# Patient Record
Sex: Female | Born: 1975 | Race: White | Hispanic: No | Marital: Married | Smoking: Former smoker
Health system: Southern US, Community
[De-identification: ages and names within clinical notes are randomized; demographics above are authoritative.]

---

## 2006-01-20 ENCOUNTER — Emergency Department: Payer: Self-pay | Admitting: Emergency Medicine

## 2010-01-15 ENCOUNTER — Emergency Department: Payer: Self-pay | Admitting: Emergency Medicine

## 2013-05-26 ENCOUNTER — Emergency Department: Payer: Self-pay | Admitting: Emergency Medicine

## 2014-09-24 ENCOUNTER — Observation Stay: Payer: Self-pay

## 2014-09-24 LAB — PIH PROFILE
Anion Gap: 9 (ref 7–16)
BUN: 9 mg/dL (ref 7–18)
CALCIUM: 9.5 mg/dL (ref 8.5–10.1)
CREATININE: 0.68 mg/dL (ref 0.60–1.30)
Chloride: 105 mmol/L (ref 98–107)
Co2: 22 mmol/L (ref 21–32)
GLUCOSE: 87 mg/dL (ref 65–99)
HCT: 35.2 % (ref 35.0–47.0)
HGB: 11.7 g/dL — ABNORMAL LOW (ref 12.0–16.0)
MCH: 30.8 pg (ref 26.0–34.0)
MCHC: 33.1 g/dL (ref 32.0–36.0)
MCV: 93 fL (ref 80–100)
Osmolality: 270 (ref 275–301)
PLATELETS: 227 10*3/uL (ref 150–440)
Potassium: 4 mmol/L (ref 3.5–5.1)
RBC: 3.79 10*6/uL — ABNORMAL LOW (ref 3.80–5.20)
RDW: 13.5 % (ref 11.5–14.5)
SGOT(AST): 20 U/L (ref 15–37)
Sodium: 136 mmol/L (ref 136–145)
URIC ACID: 4.7 mg/dL (ref 2.6–6.0)
WBC: 10.1 10*3/uL (ref 3.6–11.0)

## 2014-09-24 LAB — PROTEIN / CREATININE RATIO, URINE: CREATININE, URINE: 12.4 mg/dL — AB (ref 30.0–125.0)

## 2014-10-06 ENCOUNTER — Inpatient Hospital Stay: Payer: Self-pay | Admitting: Obstetrics and Gynecology

## 2014-12-21 LAB — SURGICAL PATHOLOGY

## 2014-12-27 NOTE — Op Note (Signed)
PATIENT NAME:  Tracey Cochran, Tracey Cochran MR#:  [id number] DATE OF BIRTH:  [birth year]  DATE OF PROCEDURE:  10/06/2014  PREOPERATIVE DIAGNOSES:  1. Severe preeclampsia at 35 and 5/7 weeks.  2. Suspected intrauterine growth restriction.  3. Category 2 fetal heart rate tracing remote from delivery.  4. Right positive group B Streptococcus unknown  5. Desires sterilization.   POSTOPERATIVE DIAGNOSES: 1. Severe preeclampsia at 35 and 5/7 weeks.  2. Suspected intrauterine growth restriction.  3. Category 2 fetal heart rate tracing remote from delivery.  4. Right positive group B Streptococcus unknown  5. Desires sterilization.  6. Delivery of a viable female infant   SURGEON: Angelina Pih, MD.  PROCEDURES: Primary low transverse cesarean section and bilateral tubal ligation by the [place] method.   ESTIMATED BLOOD LOSS: 700 mL.   INTRAVENOUS FLUIDS: 1400 mL.   URINE OUTPUT: 400 mL.   SPECIMENS:  1. Placenta.  2. Portion of right and portion of left fallopian tubes   FINDINGS: A viable female infant weighing 1760 grams or 3 pounds 14 ounces with Apgars of 9 and 9. Normal uterus, ovaries, and tubes.   COMPLICATIONS: None.   INDICATION FOR PROCEDURE: Tracey Cochran presented at 41 and 5/7 weeks with severe preeclampsia by blood pressures and suspected IUGR. She was placed on continuous fetal monitoring, where late decelerations were noted on a contraction stress test. For this reason, the decision was made to proceed with cesarean delivery. The patient and her husband were consented, with full risks and benefits disclosed. NICU was alerted and present at the delivery.   PROCEDURE: The patient was taken to the operating room, where she was identified by name and birthdate. She was placed on the operating table in the upright position and spinal anesthesia was induced without difficulty. She was then positioned in a supine position and prepped and draped in the usual sterile fashion. Ancef 3 grams  were given prior to the start of the procedure for antibiotic prophylaxis. A formal timeout procedure was performed, with all team members present and in agreement.   A Pfannenstiel skin incision was made and taken down to the underlying fascia sharply. The fascia was incised in the midline, and the incision was carried laterally using Mayo scissors. The superior portion of the rectus fascia was tented up and dissected off the underlying rectus muscles, sharply. In a similar fashion, the inferior rectus fascia was tented up and dissected off the underlying rectus and pyramidalis muscle. The rectus muscles were then separated bluntly in the midline, and the peritoneal cavity was entered bluntly. The rectus muscles and peritoneal cavity entry point were stretched laterally, and a bladder blade was placed. A bladder flap was created and an Alexis retractor was placed into the peritoneal cavity. Examination of the uterus revealed no peritoneal adhesions.   A low transverse hysterotomy was carefully created, and taken down to the underlying amniotic sac. The sac was ruptured for clear fluid. The hysterotomy was extended laterally and cephalad using blunt traction. The fetal vertex was maneuvered gently to the hysterotomy and using fundal pressure, the baby was delivered atraumatically. There was no nuchal cord. The cord was doubly clamped and cut and the baby was immediately vigorous and active. The baby was passed off to awaiting NICU staff.   Cord blood was collected, and the placenta was expressed. The uterus was cleared of all clots and debris, and was exteriorized. The hysterotomy was closed in a running locked fashion using 0 Vicryl suture and was  imbricated using 0 Vicryl suture for excellent hemostasis. Attention was then turned to the left fallopian tube.   The left fallopian tube was grasped with a Babcock clamp and a small window was made in the mesosalpinx, being careful to avoid surrounding vessels.  Approximately 3 cm of the tube was tied off with double suture ligation using plain gut suture. The tube was removed using Metzenbaum scissors and hemostasis was noted. Attention then was turned to the right fallopian tube in a similar fashion. The right fallopian tube was doubly tied, both distal and proximally, and excised using Metzenbaum scissors. Excellent hemostasis was noted at the right adnexa, as well. The uterus was then carefully placed back into the peritoneal cavity, which was cleared of clots and debris. Examination of both adnexa revealed no bleeding at the tubal excision sites. Attention returned back to the hysterotomy, which was noted to be hemostatic. The Alexis retractor was then removed. A bladder blade was placed, and examination of the peritoneum revealed no new bleeding. In a similar fashion, the rectus muscles and the rectus fascia were examined and no bleeding was found. The rectus fascia was then closed using running Vicryl suture for excellent reapproximation. The subcutaneous tissue was irrigated and, as it was greater than 2 cm thick, it was closed with a running suture, loosely. The skin incision was closed using staples, withexcellent reapproximation. A pressure dressing was placed.   The patient tolerated these procedures well and is now stable in recovery. She will receive magnesium postpartum for 24 hours with careful monitoring of her pulmonary and renal status.    ____________________________ Angelina Pih, MD beb:mw D: 10/07/2014 07:36:34 ET T: 10/07/2014 11:18:57 ET JOB#: [id number]  cc: Angelina Pih, MD, <Dictator> Angelina Pih MD ELECTRONICALLY SIGNED 11/06/2014 7:28

## 2015-01-05 NOTE — H&P (Signed)
L&D Evaluation:  History:  HPI 39yo G1P0 at 35+5wks today presenting from the office for iol/severe preeclampsia and non-reassuring fetal heart tracing. She was dx with gHTN several weeks ago and started on labetolol. She is without protein in urine and does have normal PreE labs; however, pt developed severe range pressures into the 160s/100s. Growth u/s today found EFW 16% with HC <1%, normal fluid. Uterine artery Dopplers are not available at this time. NST in office was non-reactive.  Pt admitted for iol with cervical ripening: Bishop score 0. On admission, pt did have several late decels without reactivity when started on CST. Primary cesarean section called urgently for Cat II strip remote from delivery with placental insufficiency in a severe PreEclamptic.  Pt received 1 dose steriods for fetal lung maturity based on ALPS study. She will receive Mag for 24hrs postpartum and iv antihypertensives PRN severe pressures. GBS unknown, ampicillin ordered.   NICU at delivery.  She has good fetal movement, no LOF. Denies sx of PreE.  She does desire sterilization and will have BTL following delivery.   Patient's Medical History No Chronic Illness   Patient's Surgical History none   Medications Pre Natal Vitamins  labetolol 200 BID   Allergies latex   Social History none   ROS:  ROS All systems were reviewed.  HEENT, CNS, GI, GU, Respiratory, CV, Renal and Musculoskeletal systems were found to be normal.   Exam:  Vital Signs BP >140/90   Urine Protein not completed   General no apparent distress   Mental Status clear   Chest clear   Heart normal sinus rhythm   Abdomen gravid, non-tender   Estimated Fetal Weight Small for gestational age   Fetal Position vertex   Back no CVAT   Edema 1+   Reflexes 2+   Clonus negative   Pelvic no external lesions, cervix closed and thick   Mebranes Intact   FHT as above   FHT Description Late decelerations   Ucx irregular    Skin dry   Lymph no lymphadenopathy   Impression:  Impression severe preeclampsia   Plan:  Plan PIH panel, antibiotics for GBBS prophylaxis, fluids, C/S   Comments 39yo G1P0 at 35+5wks, iol/severe PreE with placental insufficiency - pLTCS planned for Cat II strip  - GBS ppx for GBS unknown in Preterm delivery - NICU at delivery - s/p 1 dose BMZ for lung maturity - BTL - Mag for 24hrs postpartum - iv antihypertensives for severe range pressures.   Electronic Signatures: Angelina Pih (MD)  (Signed 09-Feb-16 17:45)  Authored: L&D Evaluation   Last Updated: 09-Feb-16 17:45 by Angelina Pih (MD)

## 2015-05-24 ENCOUNTER — Other Ambulatory Visit: Payer: Self-pay | Admitting: Family Medicine

## 2015-05-24 DIAGNOSIS — R1012 Left upper quadrant pain: Secondary | ICD-10-CM

## 2015-05-24 DIAGNOSIS — R109 Unspecified abdominal pain: Secondary | ICD-10-CM

## 2015-05-27 ENCOUNTER — Ambulatory Visit: Payer: Self-pay

## 2015-05-27 ENCOUNTER — Ambulatory Visit
Admission: RE | Admit: 2015-05-27 | Discharge: 2015-05-27 | Disposition: A | Discharge status: Home / Self Care | Payer: 59 | Source: Ambulatory Visit | Attending: Family Medicine | Admitting: Family Medicine

## 2015-05-27 DIAGNOSIS — R1012 Left upper quadrant pain: Secondary | ICD-10-CM | POA: Insufficient documentation

## 2015-11-19 ENCOUNTER — Other Ambulatory Visit: Payer: Self-pay

## 2015-11-19 ENCOUNTER — Emergency Department
Admission: EM | Admit: 2015-11-19 | Discharge: 2015-11-19 | Disposition: A | Discharge status: Home / Self Care | Payer: 59 | Attending: Emergency Medicine | Admitting: Emergency Medicine

## 2015-11-19 ENCOUNTER — Emergency Department: Payer: 59

## 2015-11-19 ENCOUNTER — Encounter: Payer: Self-pay | Admitting: Emergency Medicine

## 2015-11-19 DIAGNOSIS — R197 Diarrhea, unspecified: Secondary | ICD-10-CM | POA: Insufficient documentation

## 2015-11-19 DIAGNOSIS — R011 Cardiac murmur, unspecified: Secondary | ICD-10-CM | POA: Diagnosis not present

## 2015-11-19 DIAGNOSIS — R110 Nausea: Secondary | ICD-10-CM | POA: Insufficient documentation

## 2015-11-19 DIAGNOSIS — Z87891 Personal history of nicotine dependence: Secondary | ICD-10-CM | POA: Insufficient documentation

## 2015-11-19 DIAGNOSIS — R079 Chest pain, unspecified: Secondary | ICD-10-CM

## 2015-11-19 LAB — COMPREHENSIVE METABOLIC PANEL
ALT: 15 U/L (ref 14–54)
ANION GAP: 3 — AB (ref 5–15)
AST: 15 U/L (ref 15–41)
Albumin: 4.3 g/dL (ref 3.5–5.0)
Alkaline Phosphatase: 68 U/L (ref 38–126)
BUN: 10 mg/dL (ref 6–20)
CHLORIDE: 107 mmol/L (ref 101–111)
CO2: 26 mmol/L (ref 22–32)
CREATININE: 0.75 mg/dL (ref 0.44–1.00)
Calcium: 9 mg/dL (ref 8.9–10.3)
GFR calc non Af Amer: >60 mL/min (ref >60)
Glucose, Bld: 103 mg/dL — ABNORMAL HIGH (ref 65–99)
POTASSIUM: 3.9 mmol/L (ref 3.5–5.1)
SODIUM: 136 mmol/L (ref 135–145)
Total Bilirubin: 0.6 mg/dL (ref 0.3–1.2)
Total Protein: 7.5 g/dL (ref 6.5–8.1)

## 2015-11-19 LAB — CBC
HCT: 37.8 % (ref 35.0–47.0)
Hemoglobin: 12.5 g/dL (ref 12.0–16.0)
MCH: 28 pg (ref 26.0–34.0)
MCHC: 33 g/dL (ref 32.0–36.0)
MCV: 84.7 fL (ref 80.0–100.0)
PLATELETS: 284 10*3/uL (ref 150–440)
RBC: 4.46 MIL/uL (ref 3.80–5.20)
RDW: 14 % (ref 11.5–14.5)
WBC: 6.3 10*3/uL (ref 3.6–11.0)

## 2015-11-19 LAB — TROPONIN I: Troponin I: <0.03 ng/mL (ref <0.031)

## 2015-11-19 MED ORDER — ONDANSETRON 4 MG PO TBDP: 4.0000 mg | ORAL_TABLET | Freq: Three times a day (TID) | ORAL | Status: AC | PRN

## 2015-11-19 NOTE — ED Provider Notes (Signed)
[facility] Emergency Department Provider Note  Time seen: 8:46 AM  I have reviewed the triage vital signs and the nursing notes.   HISTORY  Chief Complaint Chest Pain and Diarrhea    HPI Tracey Cochran is a 40 y.o. female with no past medical history who presents the emergency department left chest discomfort. According to the patient beginning last night she began experiencing discomfort and tightness in her left chest. She thought it would go away overnight however when she woke this morning she continued to have mild left chest discomfort/tightness. She also noted that she felt nauseated this morning and had 3 episodes of loose stool. Denies any abdominal pain, dysuria, black or bloody stool, fever. Describes the chest discomfort as a tightness sensation located mostly in the central to left chest, mild currently. Denies any nausea currently. Denies any shortness of breath or diaphoresis.     History reviewed. No pertinent past medical history.  There are no active problems to display for this patient.   Past Surgical History  Procedure Laterality Date  . Cesarean section      No current outpatient prescriptions on file.  Allergies Review of patient's allergies indicates no known allergies.  No family history on file.  Social History Social History  Substance Use Topics  . Smoking status: Former Research scientist (life sciences)  . Smokeless tobacco: None  . Alcohol Use: None    Review of Systems Constitutional: Negative for fever. Cardiovascular: Mild left chest tightness/discomfort Respiratory: Negative for shortness of breath. Gastrointestinal: Negative for abdominal pain. Positive for nausea, positive for diarrhea. Genitourinary: Negative for dysuria. Neurological: Negative for headache 10-point ROS otherwise negative.  ____________________________________________   PHYSICAL EXAM:  VITAL SIGNS: ED Triage Vitals  Enc Vitals Group     BP 11/19/15 0813  136/90 mmHg     Pulse Rate 11/19/15 0813 94     Resp 11/19/15 0813 18     Temp 11/19/15 0813 98.6 F (37 C)     Temp Source 11/19/15 0813 Oral     SpO2 11/19/15 0813 98 %     Weight 11/19/15 0813 210 lb (95.255 kg)     Height 11/19/15 0813 5\' 3"  (1.6 m)     Head Cir --      Peak Flow --      Pain Score --      Pain Loc --      Pain Edu? --      Excl. in [place]? --     Constitutional: Alert and oriented. Well appearing and in no distress. Eyes: Normal exam ENT   Head: Normocephalic and atraumatic.   Mouth/Throat: Mucous membranes are moist. Cardiovascular: Normal rate, regular rhythm. Mild systolic murmur. Respiratory: Normal respiratory effort without tachypnea nor retractions. Breath sounds are clear Gastrointestinal: Soft and nontender. No distention.  Musculoskeletal: Nontender with normal range of motion in all extremities. No lower extremity tenderness or edema. Neurologic:  Normal speech and language. No gross focal neurologic deficits Skin:  Skin is warm, dry and intact.  Psychiatric: Mood and affect are normal. Speech and behavior are normal.  ____________________________________________    EKG  EKG reviewed and interpreted, so shows normal sinus rhythm at 89 bpm, narrow QRS, normal axis, normal intervals, no ST changes. Normal EKG.  ____________________________________________    RADIOLOGY  Chest x-ray shows no acute abnormality  ____________________________________________    INITIAL IMPRESSION / ASSESSMENT AND PLAN / ED COURSE  Pertinent labs & imaging results that were available during my care of  the patient were reviewed by me and considered in my medical decision making (see chart for details).  Patient presents with mild left-sided chest discomfort/tightness. Overall normal exam. Well-appearing patient. EKG is normal. We will obtain a chest x-ray as well as labs, and closely monitor in the emergency department.  Labs within normal limits.  Troponin negative. EKG is normal. Chest x-ray is normal. Patient feels much better at this time. We'll discharge patient home. I discussed my normal return precautions, she is agreeable.  ____________________________________________   FINAL CLINICAL IMPRESSION(S) / ED DIAGNOSES  Chest pain   Harvest Dark, MD 11/19/15 1018

## 2015-11-19 NOTE — Discharge Instructions (Signed)
You have been seen in the emergency department today for chest pain. Your workup has shown normal results. As we discussed please follow-up with your primary care physician in the next 1-2 days for recheck. Return to the emergency department for any further chest pain, trouble breathing, or any other symptom personally concerning to yourself. ° °Nonspecific Chest Pain °It is often hard to find the cause of chest pain. There is always a chance that your pain could be related to something serious, such as a heart attack or a blood clot in your lungs. Chest pain can also be caused by conditions that are not life-threatening. If you have chest pain, it is very important to follow up with your doctor. ° °HOME CARE °· If you were prescribed an antibiotic medicine, finish it all even if you start to feel better. °· Avoid any activities that cause chest pain. °· Do not use any tobacco products, including cigarettes, chewing tobacco, or electronic cigarettes. If you need help quitting, ask your doctor. °· Do not drink alcohol. °· Take medicines only as told by your doctor. °· Keep all follow-up visits as told by your doctor. This is important. This includes any further testing if your chest pain does not go away. °· Your doctor may tell you to keep your head raised (elevated) while you sleep. °· Make lifestyle changes as told by your doctor. These may include: °¨ Getting regular exercise. Ask your doctor to suggest some activities that are safe for you. °¨ Eating a heart-healthy diet. Your doctor or a diet specialist (dietitian) can help you to learn healthy eating options. °¨ Maintaining a healthy weight. °¨ Managing diabetes, if necessary. °¨ Reducing stress. °GET HELP IF: °· Your chest pain does not go away, even after treatment. °· You have a rash with blisters on your chest. °· You have a fever. °GET HELP RIGHT AWAY IF: °· Your chest pain is worse. °· You have an increasing cough, or you cough up blood. °· You have  severe belly (abdominal) pain. °· You feel extremely weak. °· You pass out (faint). °· You have chills. °· You have sudden, unexplained chest discomfort. °· You have sudden, unexplained discomfort in your arms, back, neck, or jaw. °· You have shortness of breath at any time. °· You suddenly start to sweat, or your skin gets clammy. °· You feel nauseous. °· You vomit. °· You suddenly feel light-headed or dizzy. °· Your heart begins to beat quickly, or it feels like it is skipping beats. °These symptoms may be an emergency. Do not wait to see if the symptoms will go away. Get medical help right away. Call your local emergency services (911 in the [place] Do not drive yourself to the hospital. °  °This information is not intended to replace advice given to you by your health care provider. Make sure you discuss any questions you have with your health care provider. °  °Document Released: 01/31/2008 Document Revised: 09/04/2014 Document Reviewed: 03/20/2014 °Elsevier Interactive Patient Education ©2016 [employer] ° °

## 2015-11-19 NOTE — ED Notes (Signed)
Patient transported to X-ray 

## 2015-11-19 NOTE — ED Notes (Signed)
Chest heaviness since last pm, dizziness, nausea and diarrhea.  NAD at this time.

## 2016-11-17 ENCOUNTER — Encounter: Payer: Self-pay | Admitting: Emergency Medicine

## 2016-11-17 ENCOUNTER — Emergency Department
Admission: EM | Admit: 2016-11-17 | Discharge: 2016-11-17 | Disposition: A | Discharge status: Home / Self Care | Payer: 59 | Attending: Emergency Medicine | Admitting: Emergency Medicine

## 2016-11-17 DIAGNOSIS — K219 Gastro-esophageal reflux disease without esophagitis: Secondary | ICD-10-CM | POA: Diagnosis not present

## 2016-11-17 DIAGNOSIS — Z87891 Personal history of nicotine dependence: Secondary | ICD-10-CM | POA: Insufficient documentation

## 2016-11-17 DIAGNOSIS — R1013 Epigastric pain: Secondary | ICD-10-CM | POA: Diagnosis present

## 2016-11-17 LAB — COMPREHENSIVE METABOLIC PANEL
ALT: 16 U/L (ref 14–54)
ANION GAP: 7 (ref 5–15)
AST: 15 U/L (ref 15–41)
Albumin: 4.5 g/dL (ref 3.5–5.0)
Alkaline Phosphatase: 58 U/L (ref 38–126)
BUN: 13 mg/dL (ref 6–20)
CALCIUM: 10.6 mg/dL — AB (ref 8.9–10.3)
CHLORIDE: 105 mmol/L (ref 101–111)
CO2: 26 mmol/L (ref 22–32)
CREATININE: 0.65 mg/dL (ref 0.44–1.00)
GFR calc non Af Amer: >60 mL/min (ref >60)
Glucose, Bld: 104 mg/dL — ABNORMAL HIGH (ref 65–99)
Potassium: 4 mmol/L (ref 3.5–5.1)
SODIUM: 138 mmol/L (ref 135–145)
TOTAL PROTEIN: 8 g/dL (ref 6.5–8.1)
Total Bilirubin: 0.8 mg/dL (ref 0.3–1.2)

## 2016-11-17 LAB — URINALYSIS, COMPLETE (UACMP) WITH MICROSCOPIC
Bacteria, UA: NONE SEEN
Bilirubin Urine: NEGATIVE
GLUCOSE, UA: NEGATIVE mg/dL
KETONES UR: NEGATIVE mg/dL
LEUKOCYTES UA: NEGATIVE
NITRITE: NEGATIVE
PH: 8 (ref 5.0–8.0)
PROTEIN: NEGATIVE mg/dL
Specific Gravity, Urine: 1.002 — ABNORMAL LOW (ref 1.005–1.030)

## 2016-11-17 LAB — CBC
HCT: 38.2 % (ref 35.0–47.0)
Hemoglobin: 13 g/dL (ref 12.0–16.0)
MCH: 29.7 pg (ref 26.0–34.0)
MCHC: 33.9 g/dL (ref 32.0–36.0)
MCV: 87.7 fL (ref 80.0–100.0)
PLATELETS: 308 10*3/uL (ref 150–440)
RBC: 4.36 MIL/uL (ref 3.80–5.20)
RDW: 13.9 % (ref 11.5–14.5)
WBC: 9 10*3/uL (ref 3.6–11.0)

## 2016-11-17 LAB — LIPASE, BLOOD: LIPASE: 40 U/L (ref 11–51)

## 2016-11-17 LAB — POCT PREGNANCY, URINE: Preg Test, Ur: NEGATIVE

## 2016-11-17 MED ORDER — GI COCKTAIL ~~LOC~~
30.0000 mL | Freq: Once | ORAL | Status: AC
  Administered 2016-11-17: 30 mL via ORAL
  Filled 2016-11-17: qty 30

## 2016-11-17 MED ORDER — FAMOTIDINE IN NACL 20-0.9 MG/50ML-% IV SOLN
20.0000 mg | Freq: Once | INTRAVENOUS | Status: AC
  Administered 2016-11-17: 20 mg via INTRAVENOUS
  Filled 2016-11-17: qty 50

## 2016-11-17 MED ORDER — FAMOTIDINE 20 MG PO TABS: 20.0000 mg | ORAL_TABLET | Freq: Two times a day (BID) | ORAL | 1 refills | Status: AC

## 2016-11-17 NOTE — ED Provider Notes (Signed)
[facility] Emergency Department Provider Note  ____________________________________________  Time seen: Approximately 7:27 AM  I have reviewed the triage vital signs and the nursing notes.   HISTORY  Chief Complaint Abdominal Pain   HPI Tracey Cochran is a 41 y.o. female no significant past medical history who presents for evaluation of epigastric abdominal pain. Patient reports that her pain started yesterday evening at 8 PM she describes as a burning sensation located in her epigastrium, constant throughout the night, initially radiating to her back. She reports that the pain is getting better and is now 5 out of 10. She took Tums last night with no improvement. She denies history of reflux and denies ever having similar pain. She denies chest pain, shortness of breath, nausea, vomiting, diarrhea, constipation, fever, chills, dysuria or hematuria. Patient reports that she has been recently started on Augmentin for sinus infection. She doesn't take any other medications at home.  History reviewed. No pertinent past medical history.  There are no active problems to display for this patient.   Past Surgical History:  Procedure Laterality Date  . CESAREAN SECTION      Prior to Admission medications   Medication Sig Start Date End Date Taking? Authorizing Provider  famotidine (PEPCID) 20 MG tablet Take 1 tablet (20 mg total) by mouth 2 (two) times daily. 11/17/16 11/17/17  Rudene Re, MD  ondansetron (ZOFRAN ODT) 4 MG disintegrating tablet Take 1 tablet (4 mg total) by mouth every 8 (eight) hours as needed for nausea or vomiting. 11/19/15   Harvest Dark, MD    Allergies Patient has no known allergies.  History reviewed. No pertinent family history.  Social History Social History  Substance Use Topics  . Smoking status: Former Research scientist (life sciences)  . Smokeless tobacco: Never Used  . Alcohol use No    Review of Systems  Constitutional: Negative for  fever. Eyes: Negative for visual changes. ENT: Negative for sore throat. Neck: No neck pain  Cardiovascular: Negative for chest pain. Respiratory: Negative for shortness of breath. Gastrointestinal: + epigastric abdominal pain. No vomiting or diarrhea. Genitourinary: Negative for dysuria. Musculoskeletal: Negative for back pain. Skin: Negative for rash. Neurological: Negative for headaches, weakness or numbness. Psych: No SI or HI  ____________________________________________   PHYSICAL EXAM:  VITAL SIGNS: ED Triage Vitals [11/17/16 0607]  Enc Vitals Group     BP (!) 136/91     Pulse Rate 77     Resp 18     Temp 98.6 F (37 C)     Temp Source Oral     SpO2 99 %     Weight 219 lb (99.3 kg)     Height 5\' 3"  (1.6 m)     Head Circumference      Peak Flow      Pain Score 7     Pain Loc      Pain Edu?      Excl. in [place]?     Constitutional: Alert and oriented. Well appearing and in no apparent distress. HEENT:      Head: Normocephalic and atraumatic.         Eyes: Conjunctivae are normal. Sclera is non-icteric. EOMI. PERRL      Mouth/Throat: Mucous membranes are moist.       Neck: Supple with no signs of meningismus. Cardiovascular: Regular rate and rhythm. No murmurs, gallops, or rubs. 2+ symmetrical distal pulses are present in all extremities. No JVD. Respiratory: Normal respiratory effort. Lungs are clear to auscultation bilaterally.  No wheezes, crackles, or rhonchi.  Gastrointestinal: Soft, mild ttp over the epigastric region, and non distended with positive bowel sounds. No rebound or guarding. Genitourinary: No CVA tenderness. Musculoskeletal: Nontender with normal range of motion in all extremities. No edema, cyanosis, or erythema of extremities. Neurologic: Normal speech and language. Face is symmetric. Moving all extremities. No gross focal neurologic deficits are appreciated. Skin: Skin is warm, dry and intact. No rash noted. Psychiatric: Mood and affect are  normal. Speech and behavior are normal.  ____________________________________________   LABS (all labs ordered are listed, but only abnormal results are displayed)  Labs Reviewed  COMPREHENSIVE METABOLIC PANEL - Abnormal; Notable for the following:       Result Value   Glucose, Bld 104 (*)    Calcium 10.6 (*)    All other components within normal limits  URINALYSIS, COMPLETE (UACMP) WITH MICROSCOPIC - Abnormal; Notable for the following:    Color, Urine COLORLESS (*)    APPearance CLEAR (*)    Specific Gravity, Urine 1.002 (*)    Hgb urine dipstick MODERATE (*)    Squamous Epithelial / LPF 0-5 (*)    All other components within normal limits  LIPASE, BLOOD  CBC  POC URINE PREG, ED  POCT PREGNANCY, URINE   ____________________________________________  EKG  ED ECG REPORT I, Rudene Re, the attending physician, personally viewed and interpreted this ECG.  Normal sinus rhythm, rate of 79, normal intervals, normal axis, no ST elevations or depressions. Normal EKG. ____________________________________________  RADIOLOGY  none  ____________________________________________   PROCEDURES  Procedure(s) performed: None Procedures Critical Care performed:  None ____________________________________________   INITIAL IMPRESSION / ASSESSMENT AND PLAN / ED COURSE  41 y.o. female no significant past medical history who presents for evaluation of epigastric abdominal pain that has been present since last night. Patient is well-appearing, in no distress, has normal vital signs, she has mild tenderness to palpation in the epigastric region with no rebound or guarding. Blood work showing normal CBC, normal CMP and lipase. EKG with no evidence of ischemia. Presentation concerning for gastritis versus GERD. We'll give Pepcid and GI cocktail. Low suspicion for other acute intra-abdominal etiologies such as pancreatitis or gallbladder with normal labs and reassuring exam.  Clinical  Course as of Nov 17 908  Fri Nov 17, 2016  0851 Pain has resolved after IV Pepcid and a GI cocktail. We'll send patient home at this time with Pepcid.  [CV]    Clinical Course User Index [CV] Rudene Re, MD    Pertinent labs & imaging results that were available during my care of the patient were reviewed by me and considered in my medical decision making (see chart for details).    ____________________________________________   FINAL CLINICAL IMPRESSION(S) / ED DIAGNOSES  Final diagnoses:  Gastroesophageal reflux disease, esophagitis presence not specified      NEW MEDICATIONS STARTED DURING THIS VISIT:  New Prescriptions   FAMOTIDINE (PEPCID) 20 MG TABLET    Take 1 tablet (20 mg total) by mouth 2 (two) times daily.     Note:  This document was prepared using Dragon voice recognition software and may include unintentional dictation errors.    Rudene Re, MD 11/17/16 [phone]

## 2016-11-17 NOTE — ED Triage Notes (Signed)
Pt in with co epigastric pain since yest, no n.v.d or hx of the same. Pt is on augmentin for sinus infection.

## 2016-11-17 NOTE — ED Notes (Signed)
Pt reports some pain relief after receiving GI cocktail and IV Pepcid. Resting quietly in room with eyes closed. Respirations equal and unlabored.

## 2018-10-29 ENCOUNTER — Other Ambulatory Visit: Payer: Self-pay | Admitting: Obstetrics and Gynecology

## 2018-10-29 DIAGNOSIS — Z1231 Encounter for screening mammogram for malignant neoplasm of breast: Secondary | ICD-10-CM

## 2018-11-07 ENCOUNTER — Ambulatory Visit
Admission: RE | Admit: 2018-11-07 | Discharge: 2018-11-07 | Disposition: A | Discharge status: Home / Self Care | Payer: Managed Care, Other (non HMO) | Source: Ambulatory Visit | Attending: Obstetrics and Gynecology | Admitting: Obstetrics and Gynecology

## 2018-11-07 ENCOUNTER — Other Ambulatory Visit: Payer: Self-pay

## 2018-11-07 DIAGNOSIS — Z1231 Encounter for screening mammogram for malignant neoplasm of breast: Secondary | ICD-10-CM | POA: Diagnosis not present

## 2018-11-27 ENCOUNTER — Other Ambulatory Visit
Admission: RE | Admit: 2018-11-27 | Discharge: 2018-11-27 | Disposition: A | Discharge status: Home / Self Care | Payer: Managed Care, Other (non HMO) | Source: Ambulatory Visit | Attending: Physician Assistant | Admitting: Physician Assistant

## 2018-11-27 DIAGNOSIS — R0602 Shortness of breath: Secondary | ICD-10-CM | POA: Diagnosis not present

## 2018-11-27 DIAGNOSIS — R079 Chest pain, unspecified: Secondary | ICD-10-CM | POA: Insufficient documentation

## 2018-11-27 LAB — FIBRIN DERIVATIVES D-DIMER (ARMC ONLY): Fibrin derivatives D-dimer (ARMC): 151.74 ng/mL (FEU) (ref 0.00–499.00)

## 2019-03-18 ENCOUNTER — Other Ambulatory Visit: Payer: Self-pay | Admitting: Family Medicine

## 2019-03-18 DIAGNOSIS — R1902 Left upper quadrant abdominal swelling, mass and lump: Secondary | ICD-10-CM

## 2019-03-19 ENCOUNTER — Other Ambulatory Visit: Payer: Self-pay

## 2019-03-19 ENCOUNTER — Ambulatory Visit
Admission: RE | Admit: 2019-03-19 | Discharge: 2019-03-19 | Disposition: A | Discharge status: Home / Self Care | Payer: Managed Care, Other (non HMO) | Source: Ambulatory Visit | Attending: Family Medicine | Admitting: Family Medicine

## 2019-03-19 ENCOUNTER — Encounter (INDEPENDENT_AMBULATORY_CARE_PROVIDER_SITE_OTHER): Payer: Self-pay

## 2019-03-19 DIAGNOSIS — R1902 Left upper quadrant abdominal swelling, mass and lump: Secondary | ICD-10-CM | POA: Diagnosis not present

## 2019-03-24 ENCOUNTER — Other Ambulatory Visit: Payer: Self-pay | Admitting: Physician Assistant

## 2019-03-24 DIAGNOSIS — D171 Benign lipomatous neoplasm of skin and subcutaneous tissue of trunk: Secondary | ICD-10-CM

## 2019-03-24 DIAGNOSIS — R1012 Left upper quadrant pain: Secondary | ICD-10-CM

## 2019-04-07 ENCOUNTER — Ambulatory Visit: Payer: Managed Care, Other (non HMO)

## 2019-04-11 ENCOUNTER — Ambulatory Visit: Payer: Managed Care, Other (non HMO)

## 2019-07-16 ENCOUNTER — Other Ambulatory Visit: Payer: Self-pay

## 2019-07-16 DIAGNOSIS — Z20822 Contact with and (suspected) exposure to covid-19: Secondary | ICD-10-CM

## 2019-07-18 LAB — NOVEL CORONAVIRUS, NAA: SARS-CoV-2, NAA: NOT DETECTED

## 2019-07-28 ENCOUNTER — Other Ambulatory Visit: Payer: Self-pay

## 2019-07-28 DIAGNOSIS — Z20822 Contact with and (suspected) exposure to covid-19: Secondary | ICD-10-CM

## 2019-07-29 LAB — NOVEL CORONAVIRUS, NAA: SARS-CoV-2, NAA: NOT DETECTED

## 2019-07-30 ENCOUNTER — Other Ambulatory Visit: Payer: Self-pay

## 2019-07-30 DIAGNOSIS — Z20822 Contact with and (suspected) exposure to covid-19: Secondary | ICD-10-CM

## 2019-08-02 LAB — NOVEL CORONAVIRUS, NAA: SARS-CoV-2, NAA: NOT DETECTED

## 2019-11-13 ENCOUNTER — Other Ambulatory Visit: Payer: Self-pay | Admitting: Obstetrics and Gynecology

## 2019-11-13 DIAGNOSIS — Z1231 Encounter for screening mammogram for malignant neoplasm of breast: Secondary | ICD-10-CM

## 2019-11-26 ENCOUNTER — Ambulatory Visit
Admission: RE | Admit: 2019-11-26 | Discharge: 2019-11-26 | Disposition: A | Discharge status: Home / Self Care | Payer: Managed Care, Other (non HMO) | Source: Ambulatory Visit | Attending: Obstetrics and Gynecology | Admitting: Obstetrics and Gynecology

## 2019-11-26 DIAGNOSIS — Z1231 Encounter for screening mammogram for malignant neoplasm of breast: Secondary | ICD-10-CM | POA: Diagnosis not present

## 2020-11-03 ENCOUNTER — Other Ambulatory Visit: Payer: Self-pay | Admitting: Physician Assistant

## 2020-11-03 DIAGNOSIS — Z1231 Encounter for screening mammogram for malignant neoplasm of breast: Secondary | ICD-10-CM

## 2020-11-26 ENCOUNTER — Other Ambulatory Visit: Payer: Self-pay

## 2020-11-26 ENCOUNTER — Ambulatory Visit
Admission: RE | Admit: 2020-11-26 | Discharge: 2020-11-26 | Disposition: A | Discharge status: Home / Self Care | Payer: Managed Care, Other (non HMO) | Source: Ambulatory Visit | Attending: Physician Assistant | Admitting: Physician Assistant

## 2020-11-26 DIAGNOSIS — Z1231 Encounter for screening mammogram for malignant neoplasm of breast: Secondary | ICD-10-CM

## 2021-09-26 ENCOUNTER — Other Ambulatory Visit: Payer: Self-pay

## 2021-09-26 DIAGNOSIS — Z1231 Encounter for screening mammogram for malignant neoplasm of breast: Secondary | ICD-10-CM

## 2021-11-28 ENCOUNTER — Ambulatory Visit
Admission: RE | Admit: 2021-11-28 | Discharge: 2021-11-28 | Disposition: A | Discharge status: Home / Self Care | Payer: Managed Care, Other (non HMO) | Source: Ambulatory Visit

## 2021-11-28 DIAGNOSIS — Z1231 Encounter for screening mammogram for malignant neoplasm of breast: Secondary | ICD-10-CM | POA: Insufficient documentation

## 2021-12-23 IMAGING — MG MM DIGITAL SCREENING BILAT W/ TOMO AND CAD
8 of 14 series · 8 of 40 positions shown · non-contrast
Comparison: Previous exam(s).

CLINICAL DATA: Screening.

EXAM:
DIGITAL SCREENING BILATERAL MAMMOGRAM WITH TOMOSYNTHESIS AND CAD
TECHNIQUE: Bilateral screening digital craniocaudal and mediolateral oblique
mammograms were obtained. Bilateral screening digital breast
tomosynthesis was performed. The images were evaluated with
computer-aided detection.

[R CC synth-2D]
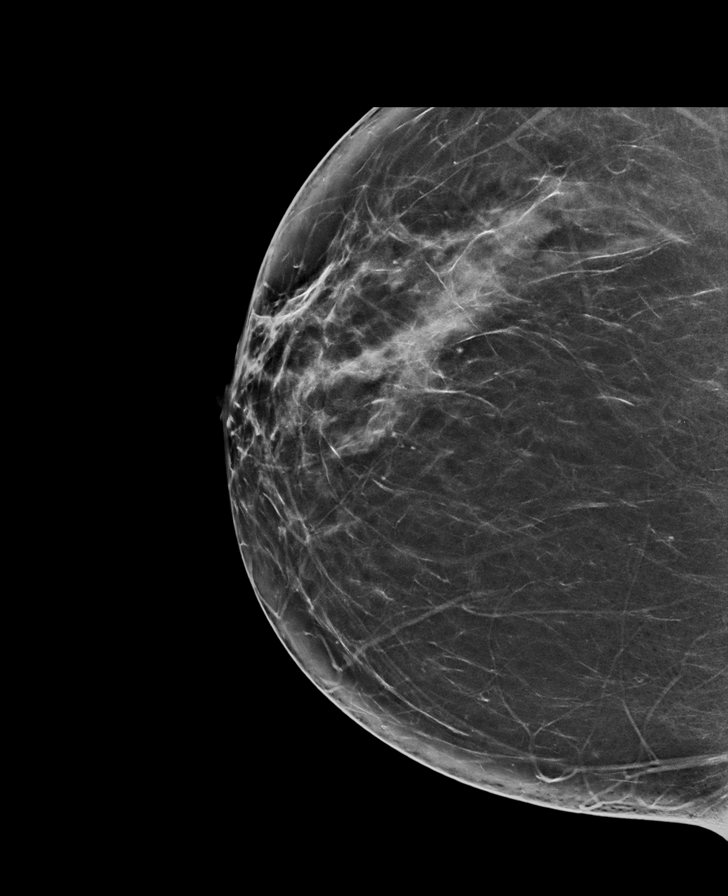

[L CC synth-2D]
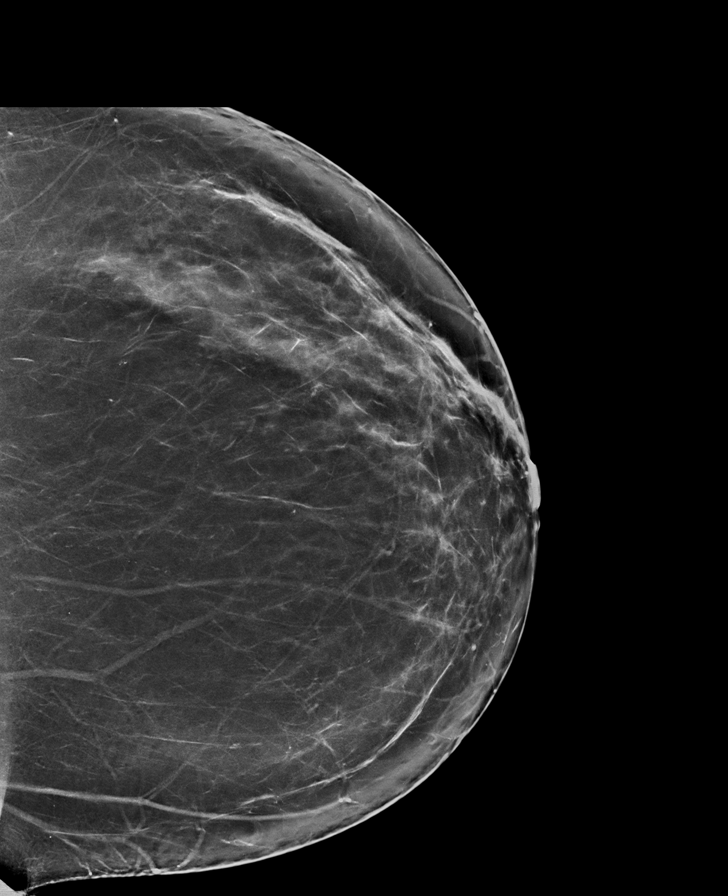

[L CV synth-2D]
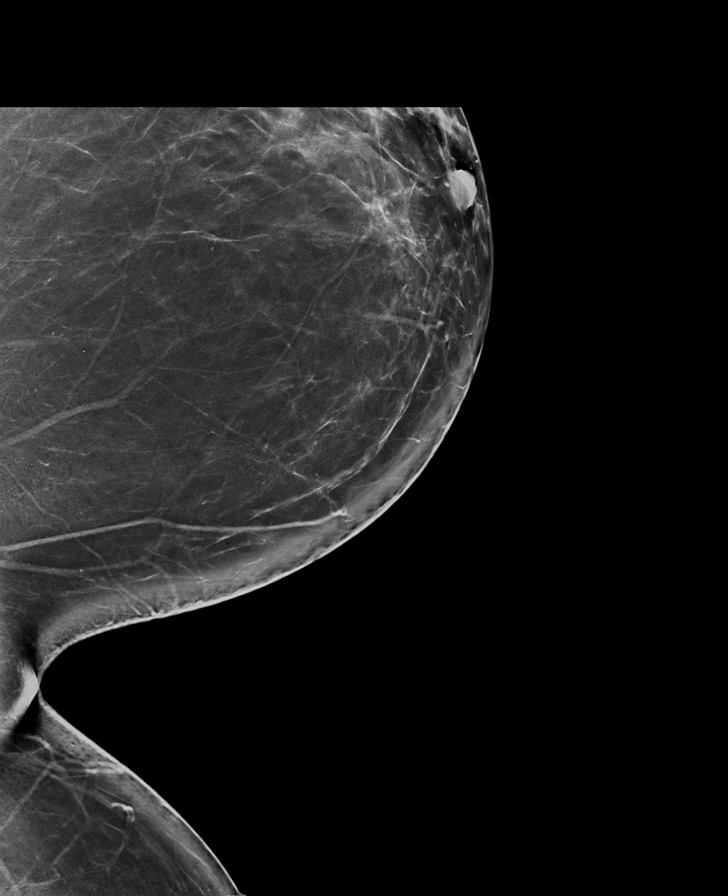

[R MLO synth-2D (1 of 2)]
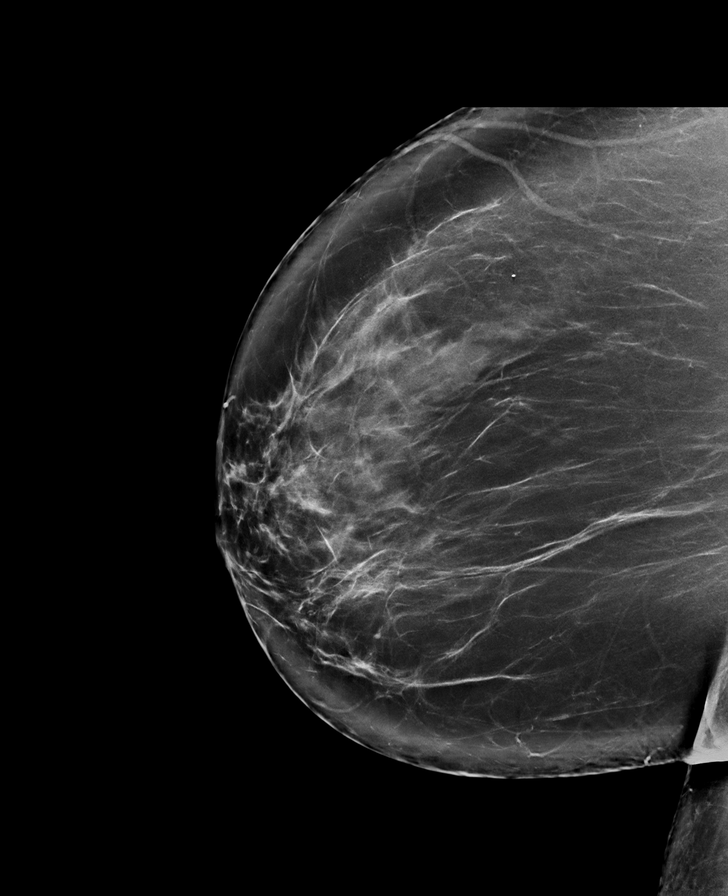

[L MLO synth-2D]
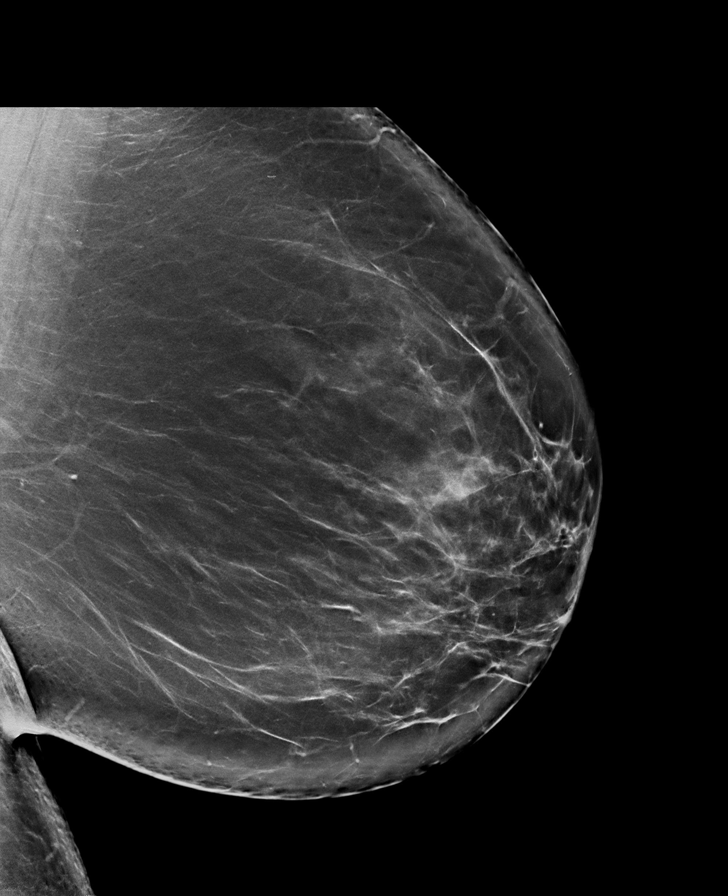

[R XCCL synth-2D]
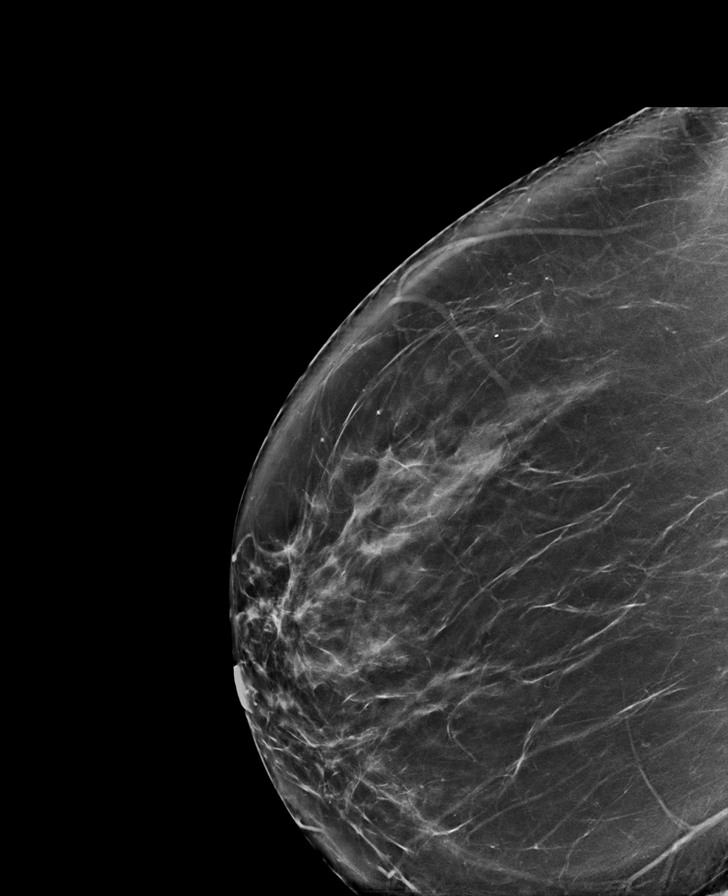

[R MLO synth-2D (2 of 2)]
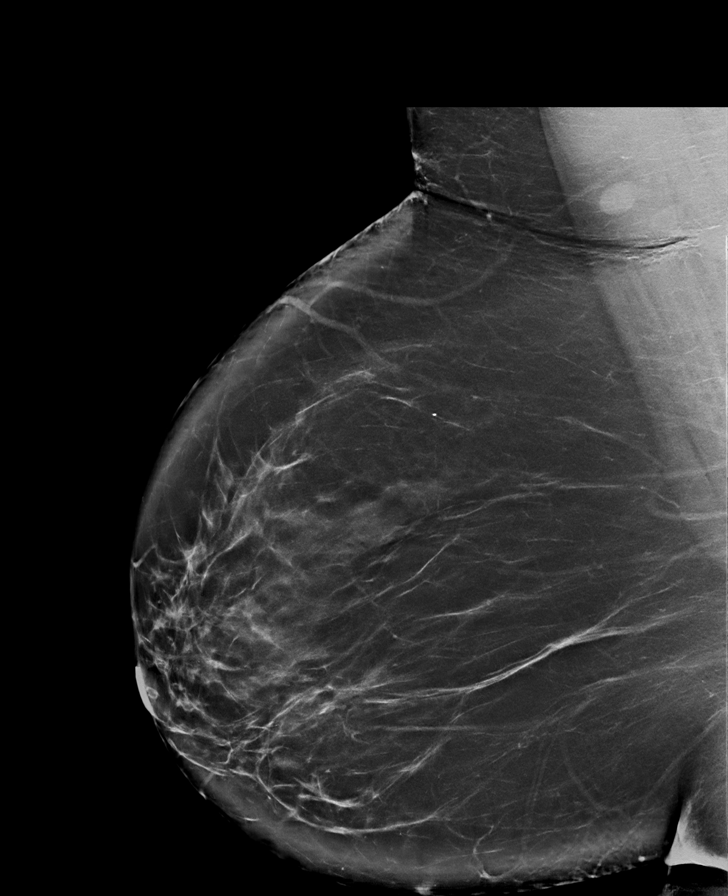

[R XCCL tomo · tomo slice 47/92.0]
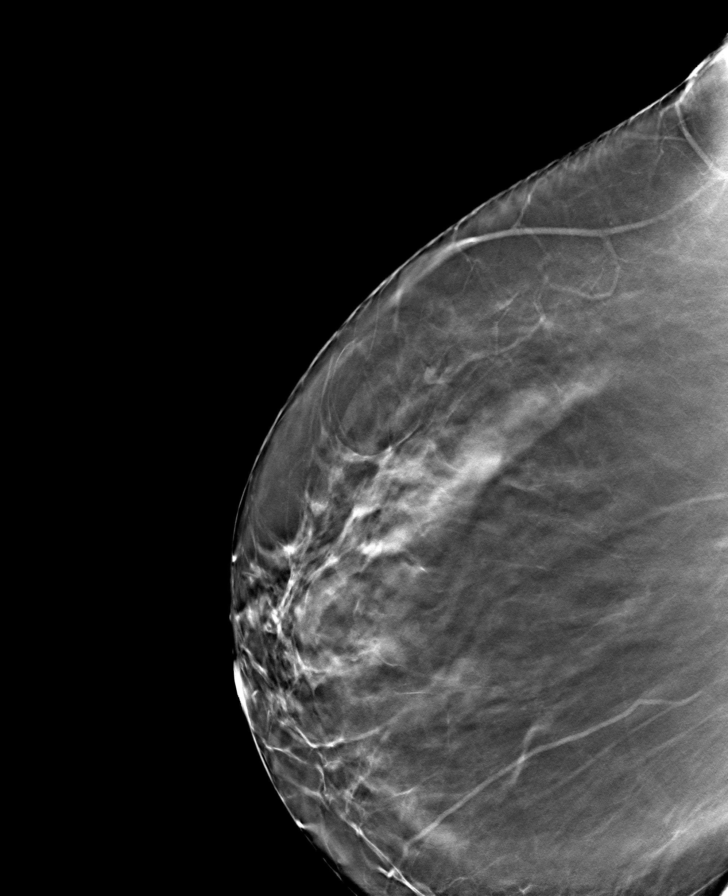

[8 of 40 positions shown; findings below may reference images not displayed]

ACR Breast Density Category b: There are scattered areas of
fibroglandular density.
FINDINGS: There are no findings suspicious for malignancy. The images were
evaluated with computer-aided detection.
IMPRESSION: No mammographic evidence of malignancy. A result letter of this
screening mammogram will be mailed directly to the patient.

RECOMMENDATION:
Screening mammogram in one year. (Code:WJ-I-BG6)

BI-RADS CATEGORY  1: Negative.

## 2022-10-31 ENCOUNTER — Other Ambulatory Visit: Payer: Self-pay | Admitting: Physician Assistant

## 2022-10-31 DIAGNOSIS — Z1231 Encounter for screening mammogram for malignant neoplasm of breast: Secondary | ICD-10-CM

## 2023-01-03 ENCOUNTER — Ambulatory Visit
Admission: RE | Admit: 2023-01-03 | Discharge: 2023-01-03 | Disposition: A | Discharge status: Home / Self Care | Payer: Managed Care, Other (non HMO) | Source: Ambulatory Visit | Attending: Physician Assistant | Admitting: Physician Assistant

## 2023-01-03 DIAGNOSIS — Z1231 Encounter for screening mammogram for malignant neoplasm of breast: Secondary | ICD-10-CM | POA: Diagnosis not present

## 2023-12-10 ENCOUNTER — Other Ambulatory Visit: Payer: Self-pay | Admitting: Physician Assistant

## 2023-12-10 DIAGNOSIS — Z1231 Encounter for screening mammogram for malignant neoplasm of breast: Secondary | ICD-10-CM

## 2024-01-04 ENCOUNTER — Encounter

## 2024-07-30 ENCOUNTER — Ambulatory Visit
Admission: RE | Admit: 2024-07-30 | Discharge: 2024-07-30 | Disposition: A | Source: Ambulatory Visit | Attending: Physician Assistant | Admitting: Physician Assistant

## 2024-07-30 DIAGNOSIS — Z1231 Encounter for screening mammogram for malignant neoplasm of breast: Secondary | ICD-10-CM | POA: Insufficient documentation
